# Patient Record
Sex: Female | Born: 1940 | Race: White | Hispanic: No | Smoking: Never smoker
Health system: Southern US, Community
[De-identification: ages and names within clinical notes are randomized; demographics above are authoritative.]

## PROBLEM LIST (undated history)

## (undated) DIAGNOSIS — F329 Major depressive disorder, single episode, unspecified: Secondary | ICD-10-CM

## (undated) DIAGNOSIS — M7989 Other specified soft tissue disorders: Secondary | ICD-10-CM

## (undated) DIAGNOSIS — D649 Anemia, unspecified: Secondary | ICD-10-CM

## (undated) DIAGNOSIS — F32A Depression, unspecified: Secondary | ICD-10-CM

## (undated) DIAGNOSIS — M79601 Pain in right arm: Secondary | ICD-10-CM

## (undated) DIAGNOSIS — R5383 Other fatigue: Secondary | ICD-10-CM

## (undated) DIAGNOSIS — R609 Edema, unspecified: Secondary | ICD-10-CM

## (undated) HISTORY — DX: Edema, unspecified: R60.9

## (undated) HISTORY — PX: CARPAL TUNNEL RELEASE: SHX101

## (undated) HISTORY — DX: Anemia, unspecified: D64.9

## (undated) HISTORY — PX: KNEE SURGERY: SHX244

## (undated) HISTORY — PX: EYE SURGERY: SHX253

## (undated) HISTORY — DX: Other specified soft tissue disorders: M79.89

## (undated) HISTORY — DX: Depression, unspecified: F32.A

## (undated) HISTORY — PX: ABDOMINAL HYSTERECTOMY: SHX81

## (undated) HISTORY — DX: Pain in right arm: M79.601

## (undated) HISTORY — DX: Other fatigue: R53.83

## (undated) HISTORY — DX: Major depressive disorder, single episode, unspecified: F32.9

---

## 2010-07-27 ENCOUNTER — Emergency Department (HOSPITAL_COMMUNITY)
Admission: EM | Admit: 2010-07-27 | Discharge: 2010-07-28 | Payer: Self-pay | Source: Home / Self Care | Admitting: Emergency Medicine

## 2010-07-28 ENCOUNTER — Emergency Department (HOSPITAL_COMMUNITY)
Admission: EM | Admit: 2010-07-28 | Discharge: 2010-07-28 | Payer: Self-pay | Source: Home / Self Care | Admitting: Emergency Medicine

## 2011-04-14 ENCOUNTER — Other Ambulatory Visit: Payer: Self-pay

## 2011-04-14 DIAGNOSIS — M7989 Other specified soft tissue disorders: Secondary | ICD-10-CM

## 2011-04-21 ENCOUNTER — Encounter: Payer: Self-pay | Admitting: Vascular Surgery

## 2011-04-25 ENCOUNTER — Other Ambulatory Visit: Payer: Self-pay | Admitting: Lab

## 2011-04-25 DIAGNOSIS — M7989 Other specified soft tissue disorders: Secondary | ICD-10-CM

## 2011-04-25 DIAGNOSIS — R609 Edema, unspecified: Secondary | ICD-10-CM

## 2011-04-27 ENCOUNTER — Encounter: Payer: Self-pay | Admitting: Vascular Surgery

## 2011-04-28 ENCOUNTER — Encounter: Payer: Self-pay | Admitting: Vascular Surgery

## 2011-04-28 ENCOUNTER — Ambulatory Visit (INDEPENDENT_AMBULATORY_CARE_PROVIDER_SITE_OTHER): Payer: Medicare Other | Admitting: Vascular Surgery

## 2011-04-28 VITALS — BP 106/70 | HR 65 | Resp 22 | Ht 60.0 in | Wt 168.0 lb

## 2011-04-28 DIAGNOSIS — M7989 Other specified soft tissue disorders: Secondary | ICD-10-CM

## 2011-04-28 DIAGNOSIS — R609 Edema, unspecified: Secondary | ICD-10-CM

## 2011-04-28 DIAGNOSIS — I83893 Varicose veins of bilateral lower extremities with other complications: Secondary | ICD-10-CM

## 2011-04-28 NOTE — Progress Notes (Signed)
[facility] [place] HISTORY AND PHYSICAL   History of Present Illness:  Patient is a 70 y.o. year old female who presents for evaluation of leg swelling.  The swelling primarily occurs at the ankles and feet that sometimes in the leg as well. Is been worse over the last week. The swelling has been present for proximal in 6 months. She denies prior history of DVT. She denies family history of varicose veins. She did buy some thin support stockings recently and states that they have helped somewhat. She also thinks is are swelling may be related to an anti-cholesterol medication.  She has had no previous interventions on her lower extremities for vein or arterial problems. She has had some work done on her right knee from a previous injury. She has not really on her feet that much during the day and is retired.  Past Medical History  Diagnosis Date  . Anemia   . Edema   . Bilateral swelling of feet   . Arm pain, right   . Depression   . Fatigue     Past Surgical History  Procedure Date  . Carpal tunnel release   . Abdominal hysterectomy   . Eye surgery     bilateral cataract surgery  . Knee surgery     ROS: [x]  Positive   [ ]  Negative   [ ]  All sytems reviewed and are negative  General:[ ]  Weight loss, [ ]  Fever, [ ]  chills Neurologic: [ ]  Dizziness, [ ]  Blackouts, [ ]  Seizure [ ]  Stroke, [ ]  "Mini stroke", [ ]  Slurred speech, [ ]  Temporary blindness;  [ ] weakness,  [ ]  Hoarseness Cardiac: [ ]  Chest pain/pressure, [ ]  Shortness of breath at rest [x ] Shortness of breath with exertion,  [ ]   Atrial fibrillation or irregular heartbeat Vascular:[x ] Pain in legs with walking, [ ]  Pain in legs at rest ,[ ]  Pain in legs at night,  [ ]   Non-healing ulcer, [ ]  Blood clot in vein/DVT,   Pulmonary: [ ]  Home oxygen, [ ]   Productive cough, [ ]  Coughing up blood,  [ ]  Asthma,  [ ]  Wheezing Musculoskeletal:  [ ]  Arthritis, [ ]  Low back pain,  [ ]  Joint pain Hematologic:[ ]   Easy Bruising, [x ] Anemia; [ ]  Hepatitis Gastrointestinal: [ ]  Blood in stool,  [ ]  Gastroesophageal Reflux, [ ]  Trouble swallowing Urinary: [ ]  chronic Kidney disease, [ ]  on HD - [ ]  MWF or [ ]  TTHS, [ ]  Burning with urination, [ ]  Frequent urination, [ ]  Difficulty urinating;  Skin: [ ]  Rashes, [ ]  Wounds Psychological: [x ] Anxiety,  [ x] Depression  Social History History  Substance Use Topics  . Smoking status: Never Smoker   . Smokeless tobacco: Not on file  . Alcohol Use: No    Family History History reviewed. No pertinent family history.  Allergies  Allergies  Allergen Reactions  . Latex   . Morphine And Related      Current Outpatient Prescriptions  Medication Sig Dispense Refill  . ALPRAZolam (XANAX) 1 MG tablet Take 1 mg by mouth at bedtime as needed.        Marland Kitchen FLUoxetine (PROZAC) 20 MG capsule Take 20 mg by mouth daily.        Marland Kitchen gemfibrozil (LOPID) 600 MG tablet Take 600 mg by mouth 2 (two) times daily before a meal.        . propranolol (INDERAL) 10 MG  tablet Take 10 mg by mouth 2 (two) times daily.        . valACYclovir (VALTREX) 500 MG tablet Take 500 mg by mouth as needed.        . zolpidem (AMBIEN) 10 MG tablet Take 10 mg by mouth at bedtime as needed.          Physical Examination  Filed Vitals:   04/28/11 1447  BP: 106/70  Pulse: 65  Resp: 22  Height: 5' (1.524 m)  Weight: 168 lb (76.204 kg)  SpO2: 98%    Body mass index is 32.81 kg/(m^2).  General:  Alert and oriented, no acute distress HEENT: Normal Neck: No bruit or JVD Pulmonary: Clear to auscultation bilaterally Cardiac: Regular Rate and Rhythm without murmur Abdomen: Soft, non-tender, non-distended, no mass, lower midline scar, obese Skin: No rash Extremity Pulses:  2+ radial, brachial, femoral, dorsalis pedis, posterior tibial pulses bilaterally Musculoskeletal: mild varus deformity right knee trace edema pretibial bilaterally Neurologic: Upper and lower extremity motor 5/5 and  symmetric  DATA: Venous duplex exam today shows a short midsegment of the left greater saphenous vein with reflux she also had evidence of reflux in the right lesser saphenous vein. This was mild bilaterally. There is no evidence of DVT. She also had some evidence of deep venous reflux in the left leg.   ASSESSMENT: Bilateral lower extremity swelling probably multifactorial in nature. Some component of this is due to mild reflux of her superficial and deep system primarily in the left leg. She has no evidence of arterial insufficiency with palpable pulses bilaterally.  PLAN: Since her symptoms are overall mild in character I believe the best option would be to place her and bilateral compression stockings. I offered her a prescription compression stockings today and she chooses to use the stockings that she currently has. She will discuss with her primary care doctor whether or not her cholesterol medication may be contributing. She will followup with me on an as-needed basis.  Fabienne Bruns, MD [facility] [place] Office: [phone] Pager: [phone]

## 2011-05-03 NOTE — Procedures (Unsigned)
LOWER EXTREMITY VENOUS REFLUX EXAM  INDICATION:  Lower extremity varicose veins, lower extremity edema.  EXAM:  Using color-flow imaging and pulse Doppler spectral analysis, the bilateral common femoral, superficial femoral, popliteal, posterior tibial, greater and lesser saphenous veins are evaluated.  There is evidence suggesting deep venous insufficiency in the left lower extremity.  There is no evidence of suggesting deep venous insufficiency in the right lower extremity.  The bilateral saphenofemoral junctions are competent. The right GSV competent.  The left GSV is not competent with reflux >500 milliseconds with the caliber s described below.   The right proximal short saphenous vein demonstrates incompetency with diameter measurements ranging from 0.50 to 0.21 cm.  The left proximal small saphenous vein demonstrates competency.  GSV Diameter (used if found to be incompetent only)                                           Right    Left Proximal Greater Saphenous Vein           cm       cm Proximal-to-mid-thigh                     cm       cm Mid thigh                                 cm       0.34 cm Mid-distal thigh                          cm       cm Distal thigh                              cm       cm Knee                                      cm       cm  IMPRESSION: 1. The right great saphenous vein is competent. 2. The left great saphenous vein is not competent with reflux >500     milliseconds. 3. The bilateral great saphenous veins are tortuous. 4. The deep venous system of the right lower extremity is competent. 5. The left deep venous system is not competent with reflux of >500     milliseconds. 6. The right small saphenous vein is competent. 7. The left small saphenous vein is not competent with reflux >500     milliseconds with the calibers as described above.  ___________________________________________ Janetta Hora. Fields, MD  SH/MEDQ   D:  04/28/2011  T:  04/28/2011  Job:  [id number]

## 2011-09-19 ENCOUNTER — Ambulatory Visit: Payer: Self-pay | Admitting: Family Medicine

## 2011-09-26 ENCOUNTER — Ambulatory Visit: Payer: Self-pay | Admitting: Family Medicine
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
## 2012-01-27 ENCOUNTER — Other Ambulatory Visit: Payer: Self-pay | Admitting: Obstetrics and Gynecology

## 2012-01-27 DIAGNOSIS — Z1231 Encounter for screening mammogram for malignant neoplasm of breast: Secondary | ICD-10-CM

## 2012-03-14 ENCOUNTER — Ambulatory Visit: Payer: Medicare Other

## 2012-03-15 ENCOUNTER — Ambulatory Visit
Admission: RE | Admit: 2012-03-15 | Discharge: 2012-03-15 | Disposition: A | Discharge status: Home / Self Care | Payer: Medicare Other | Source: Ambulatory Visit | Attending: Obstetrics and Gynecology | Admitting: Obstetrics and Gynecology

## 2012-03-15 DIAGNOSIS — Z1231 Encounter for screening mammogram for malignant neoplasm of breast: Secondary | ICD-10-CM

## 2012-03-22 ENCOUNTER — Ambulatory Visit: Payer: Self-pay | Admitting: Family Medicine

## 2012-03-23 ENCOUNTER — Encounter: Payer: Self-pay | Admitting: Family Medicine

## 2012-03-23 ENCOUNTER — Telehealth: Payer: Self-pay | Admitting: Family Medicine

## 2012-03-23 ENCOUNTER — Ambulatory Visit (INDEPENDENT_AMBULATORY_CARE_PROVIDER_SITE_OTHER): Payer: Medicare Other | Admitting: Family Medicine

## 2012-03-23 VITALS — BP 105/79 | HR 78 | Temp 97.8°F | Resp 16 | Ht 60.0 in | Wt 179.0 lb

## 2012-03-23 DIAGNOSIS — G250 Essential tremor: Secondary | ICD-10-CM | POA: Insufficient documentation

## 2012-03-23 DIAGNOSIS — N760 Acute vaginitis: Secondary | ICD-10-CM

## 2012-03-23 DIAGNOSIS — B009 Herpesviral infection, unspecified: Secondary | ICD-10-CM | POA: Insufficient documentation

## 2012-03-23 DIAGNOSIS — R600 Localized edema: Secondary | ICD-10-CM | POA: Insufficient documentation

## 2012-03-23 DIAGNOSIS — F418 Other specified anxiety disorders: Secondary | ICD-10-CM | POA: Insufficient documentation

## 2012-03-23 DIAGNOSIS — E785 Hyperlipidemia, unspecified: Secondary | ICD-10-CM | POA: Insufficient documentation

## 2012-03-23 DIAGNOSIS — R609 Edema, unspecified: Secondary | ICD-10-CM

## 2012-03-23 LAB — CBC WITH DIFFERENTIAL/PLATELET
Basophils Absolute: 0.1 10*3/uL (ref 0.0–0.1)
Basophils Relative: 1 % (ref 0–1)
MCHC: 34.6 g/dL (ref 30.0–36.0)
Monocytes Absolute: 0.7 10*3/uL (ref 0.1–1.0)
Neutro Abs: 3.2 10*3/uL (ref 1.7–7.7)
Neutrophils Relative %: 46 % (ref 43–77)
Platelets: 257 10*3/uL (ref 150–400)
RDW: 12.6 % (ref 11.5–15.5)

## 2012-03-23 LAB — POCT WET PREP WITH KOH
Clue Cells Wet Prep HPF POC: NEGATIVE
KOH Prep POC: NEGATIVE
Trichomonas, UA: NEGATIVE

## 2012-03-23 LAB — COMPREHENSIVE METABOLIC PANEL
ALT: 10 U/L (ref 0–35)
Albumin: 4 g/dL (ref 3.5–5.2)
CO2: 27 mEq/L (ref 19–32)
Calcium: 9.4 mg/dL (ref 8.4–10.5)
Chloride: 105 mEq/L (ref 96–112)
Glucose, Bld: 81 mg/dL (ref 70–99)
Sodium: 140 mEq/L (ref 135–145)
Total Protein: 6.3 g/dL (ref 6.0–8.3)

## 2012-03-23 LAB — TSH: TSH: 0.98 u[IU]/mL (ref 0.350–4.500)

## 2012-03-23 MED ORDER — PROPRANOLOL HCL ER 80 MG PO CP24: ORAL_CAPSULE | ORAL | Status: DC

## 2012-03-23 MED ORDER — PROPRANOLOL HCL 60 MG PO TABS: 60.0000 mg | ORAL_TABLET | Freq: Three times a day (TID) | ORAL | Status: DC

## 2012-03-23 MED ORDER — FUROSEMIDE 20 MG PO TABS: ORAL_TABLET | ORAL | Status: DC

## 2012-03-23 NOTE — Patient Instructions (Addendum)
Peripheral Edema You have swelling in your legs (peripheral edema). This swelling is due to excess accumulation of salt and water in your body. Edema may be a sign of heart, kidney or liver disease, or a side effect of a medication. It may also be due to problems in the leg veins. Elevating your legs and using special support stockings may be very helpful, if the cause of the swelling is due to poor venous circulation. Avoid long periods of standing, whatever the cause. Treatment of edema depends on identifying the cause. Chips, pretzels, pickles and other salty foods should be avoided. Restricting salt in your diet is almost always needed. Water pills (diuretics) are often used to remove the excess salt and water from your body via urine. These medicines prevent the kidney from reabsorbing sodium. This increases urine flow. Diuretic treatment may also result in lowering of potassium levels in your body. Potassium supplements may be needed if you have to use diuretics daily. Daily weights can help you keep track of your progress in clearing your edema. You should call your caregiver for follow up care as recommended. SEEK IMMEDIATE MEDICAL CARE IF:   You have increased swelling, pain, redness, or heat in your legs.   You develop shortness of breath, especially when lying down.   You develop chest or abdominal pain, weakness, or fainting.   You have a fever.  Document Released: 08/11/2004 Document Revised: 06/23/2011 Document Reviewed: 07/22/2009 [facility] Patient Information 2012 [place], [place].   You need to try to elevate your legs twice a day for 30 minutes if possible to help relieve swelling in legs.   Tremor Tremor is a rhythmic, involuntary muscular contraction characterized by oscillations (to-and-fro movements) of a part of the body. The most common of all involuntary movements, tremor can affect various body parts such as the hands, head, facial structures, vocal cords, trunk, and  legs; most tremors, however, occur in the hands. Tremor often accompanies neurological disorders associated with aging. Although the disorder is not life-threatening, it can be responsible for functional disability and social embarrassment. TREATMENT  There are many types of tremor and several ways in which tremor is classified. The most common classification is by behavioral context or position. There are five categories of tremor within this classification: resting, postural, kinetic, task-specific, and psychogenic. Resting or static tremor occurs when the muscle is at rest, for example when the hands are lying on the lap. This type of tremor is often seen in patients with Parkinson's disease. Postural tremor occurs when a patient attempts to maintain posture, such as holding the hands outstretched. Postural tremors include physiological tremor, essential tremor, tremor with basal ganglia disease (also seen in patients with Parkinson's disease), cerebellar postural tremor, tremor with peripheral neuropathy, post-traumatic tremor, and alcoholic tremor. Kinetic or intention (action) tremor occurs during purposeful movement, for example during finger-to-nose testing. Task-specific tremor appears when performing goal-oriented tasks such as handwriting, speaking, or standing. This group consists of primary writing tremor, vocal tremor, and orthostatic tremor. Psychogenic tremor occurs in both older and younger patients. The key feature of this tremor is that it dramatically lessens or disappears when the patient is distracted. PROGNOSIS There are some treatment options available for tremor; the appropriate treatment depends on accurate diagnosis of the cause. Some tremors respond to treatment of the underlying condition, for example in some cases of psychogenic tremor treating the patient's underlying mental problem may cause the tremor to disappear. Also, patients with tremor due to Parkinson's disease may  be  treated with Levodopa drug therapy. Symptomatic drug therapy is available for several other tremors as well. For those cases of tremor in which there is no effective drug treatment, physical measures such as teaching the patient to brace the affected limb during the tremor are sometimes useful. Surgical intervention such as thalamotomy or deep brain stimulation may be useful in certain cases. Document Released: 06/24/2002 Document Revised: 06/23/2011 Document Reviewed: 07/04/2005 [facility] Patient Information 2012 [place], [place].
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
## 2012-03-28 ENCOUNTER — Encounter: Payer: Self-pay | Admitting: Family Medicine

## 2012-03-28 NOTE — Progress Notes (Signed)
Quick Note:  Please notify pt that results are normal.   Provide pt with copy of labs. ______ 

## 2012-04-02 NOTE — Telephone Encounter (Signed)
There was some confusion about the dose of Propranolol that the pt was taking; she wanted it increased, if possible, from twice a day (it was unclear which formulation she was taking- ?ER). We requested a fax from Dr. [name] to clarify dose; that Provider did increase dose from Propranolol ER 80 mg 1x daily to 2x daily. I refilled the medication at that same dose and frequency and left a phone msg for the pt to advise her of this.

## 2012-04-26 ENCOUNTER — Encounter: Payer: Self-pay | Admitting: Family Medicine

## 2012-04-26 ENCOUNTER — Ambulatory Visit (INDEPENDENT_AMBULATORY_CARE_PROVIDER_SITE_OTHER): Payer: Medicare Other | Admitting: Family Medicine

## 2012-04-26 VITALS — BP 115/70 | HR 64 | Temp 97.7°F | Resp 16 | Ht 60.0 in | Wt 135.0 lb

## 2012-04-26 DIAGNOSIS — R9431 Abnormal electrocardiogram [ECG] [EKG]: Secondary | ICD-10-CM

## 2012-04-26 DIAGNOSIS — R609 Edema, unspecified: Secondary | ICD-10-CM

## 2012-04-26 DIAGNOSIS — R600 Localized edema: Secondary | ICD-10-CM

## 2012-04-26 DIAGNOSIS — Z0000 Encounter for general adult medical examination without abnormal findings: Secondary | ICD-10-CM

## 2012-04-26 LAB — POCT URINALYSIS DIPSTICK
Ketones, UA: NEGATIVE
Leukocytes, UA: NEGATIVE
Protein, UA: NEGATIVE
pH, UA: 7

## 2012-04-26 MED ORDER — VALACYCLOVIR HCL 500 MG PO TABS: 500.0000 mg | ORAL_TABLET | ORAL | Status: DC | PRN

## 2012-04-26 MED ORDER — GEMFIBROZIL 600 MG PO TABS: 600.0000 mg | ORAL_TABLET | Freq: Two times a day (BID) | ORAL | Status: DC

## 2012-04-26 MED ORDER — FUROSEMIDE 20 MG PO TABS: ORAL_TABLET | ORAL | Status: DC

## 2012-04-27 ENCOUNTER — Telehealth: Payer: Self-pay

## 2012-04-27 NOTE — Telephone Encounter (Signed)
The patient called back to make sure that we have steroids as an allergy for her.  The patient is also questioning something about a heart monitor.  Please call the patient to clarify.  Patient phone number is [phone].

## 2012-04-27 NOTE — Telephone Encounter (Signed)
The patient called to verify that Prozac was taken out of her record since she is on Pristiq and that medication is working much better than the Prozac she was on previously.  Please call patient at [phone] with any questions.

## 2012-04-28 NOTE — Telephone Encounter (Signed)
Left message for patient to return call.

## 2012-05-01 ENCOUNTER — Encounter: Payer: Self-pay | Admitting: Family Medicine

## 2012-05-01 ENCOUNTER — Other Ambulatory Visit: Payer: Self-pay | Admitting: Internal Medicine

## 2012-05-01 NOTE — Progress Notes (Signed)
Subjective:    Patient ID: Alexis Pham, female    DOB: 15-Jul-1941, 71 y.o.   MRN: [id number]  HPI  This 71 y.o. Female is here for Annual Medicare Wellness visit. She has chronic depression  which is managed by Dr. [name]; she completed a BECK'S DEPRESSION SCALE (Total score=6).  She has no Home Safety issues.  She exercises daily, walking for 30 minutes.   Last PAP: Oct 2013 (Abnormal)  Last MMG: Oct 2013 (Normal)  Last DEXA: 2013 (Normal)  Last CRS: 2013 (Normal)  Review of Systems  HENT: Positive for dental problem.   Eyes: Positive for discharge, redness, itching and visual disturbance.       Sees an Eye specialist in [place], [place].  Cardiovascular: Positive for leg swelling.  Gastrointestinal: Positive for abdominal pain, diarrhea, constipation and anal bleeding.       Sees a GI specialist here in [place]  Genitourinary: Positive for vaginal discharge, difficulty urinating and genital sores.       GYN exam done elsewhere  Musculoskeletal: Positive for arthralgias.  Neurological: Positive for tremors.  Psychiatric/Behavioral: Positive for disturbed wake/sleep cycle and dysphoric mood.       Dr. [name] has changed med to Pristiq which pt states is very effective  All other systems reviewed and are negative.       Objective:   Physical Exam  Vitals reviewed. Constitutional: She is oriented to person, place, and time. She appears well-developed and well-nourished. No distress.  HENT:  Head: Normocephalic and atraumatic.  Right Ear: Hearing, tympanic membrane, external ear and ear canal normal.  Left Ear: Hearing, tympanic membrane, external ear and ear canal normal.  Nose: Nose normal. No mucosal edema, nasal deformity or septal deviation. Right sinus exhibits no maxillary sinus tenderness and no frontal sinus tenderness. Left sinus exhibits no maxillary sinus tenderness and no frontal sinus tenderness.  Mouth/Throat: Uvula is midline, oropharynx is clear and moist and  mucous membranes are normal. No oral lesions. Normal dentition.  Eyes: Conjunctivae normal, EOM and lids are normal. Pupils are equal, round, and reactive to light.  Neck: Normal range of motion. Neck supple. No thyromegaly present.  Cardiovascular: Normal rate, regular rhythm, normal heart sounds and intact distal pulses.  Exam reveals no gallop and no friction rub.   No murmur heard. Pulmonary/Chest: Effort normal and breath sounds normal. No respiratory distress. She has no wheezes.  Abdominal: Soft. Normal appearance. She exhibits no distension, no abdominal bruit, no pulsatile midline mass and no mass. Bowel sounds are increased. There is no hepatosplenomegaly. There is tenderness in the epigastric area and suprapubic area. There is no rigidity, no rebound, no guarding, no CVA tenderness, no tenderness at McBurney's point and negative Murphy's sign.  Genitourinary:       Deferred  Musculoskeletal: Normal range of motion. She exhibits tenderness. She exhibits no edema.       Stiffness and minimal crepitus noted in major joints; no redness or significant deformities  Lymphadenopathy:    She has no cervical adenopathy.  Neurological: She is alert and oriented to person, place, and time. She has normal strength and normal reflexes. She displays tremor. No cranial nerve deficit or sensory deficit. She exhibits normal muscle tone. Coordination and gait normal.  Skin: Skin is warm and dry. No rash noted. No erythema. No pallor.  Psychiatric: She has a normal mood and affect. Her behavior is normal. Judgment and thought content normal.    Results for orders placed in visit on 04/26/12  POCT URINALYSIS DIPSTICK      Component Value Range   Color, UA yellow     Clarity, UA clear     Glucose, UA neg     Bilirubin, UA neg     Ketones, UA neg     Spec Grav, UA 1.015     Blood, UA neg     pH, UA 7.0     Protein, UA neg     Urobilinogen, UA 0.2     Nitrite, UA neg     Leukocytes, UA Negative         ECG: Sinus rhythm with borderline 1st degree A-V block; inferior and anterior septal ST-T changes.     Assessment & Plan:   1. Routine general medical examination at a health care facility  POCT urinalysis dipstick, EKG 12-Lead  2. Abnormal ECG  Ambulatory referral to Cardiology  3. Lower extremity edema  RF: Furosemide 20 mg 1/2 - 1 tablet every AM prn.

## 2012-05-02 NOTE — Telephone Encounter (Signed)
Spoke with pt, she advised me she was talking about a bill. Billing took care of it.

## 2012-05-28 ENCOUNTER — Encounter: Payer: Self-pay | Admitting: Cardiovascular Disease

## 2012-05-28 ENCOUNTER — Ambulatory Visit (INDEPENDENT_AMBULATORY_CARE_PROVIDER_SITE_OTHER): Payer: Medicare Other | Admitting: Cardiovascular Disease

## 2012-05-28 VITALS — BP 113/76 | HR 63 | Wt 165.0 lb

## 2012-05-28 DIAGNOSIS — Z79899 Other long term (current) drug therapy: Secondary | ICD-10-CM

## 2012-05-28 DIAGNOSIS — R0989 Other specified symptoms and signs involving the circulatory and respiratory systems: Secondary | ICD-10-CM

## 2012-05-28 DIAGNOSIS — Z139 Encounter for screening, unspecified: Secondary | ICD-10-CM

## 2012-05-28 DIAGNOSIS — R0609 Other forms of dyspnea: Secondary | ICD-10-CM

## 2012-05-28 DIAGNOSIS — R0600 Dyspnea, unspecified: Secondary | ICD-10-CM

## 2012-05-28 DIAGNOSIS — R609 Edema, unspecified: Secondary | ICD-10-CM

## 2012-05-28 DIAGNOSIS — R9431 Abnormal electrocardiogram [ECG] [EKG]: Secondary | ICD-10-CM

## 2012-05-28 DIAGNOSIS — R600 Localized edema: Secondary | ICD-10-CM

## 2012-05-28 DIAGNOSIS — R079 Chest pain, unspecified: Secondary | ICD-10-CM

## 2012-05-28 MED ORDER — FUROSEMIDE 20 MG PO TABS: ORAL_TABLET | ORAL | Status: DC

## 2012-05-28 NOTE — Assessment & Plan Note (Signed)
Refill for lasix called in Continue low sodium diet  Echo for RV/LV function

## 2012-05-28 NOTE — Assessment & Plan Note (Signed)
I assume this is why she is on inderal stable

## 2012-05-28 NOTE — Assessment & Plan Note (Signed)
Stable continue current meds and F/U Dr [name] q3 months

## 2012-05-28 NOTE — Assessment & Plan Note (Signed)
Cholesterol is at goal.  Continue current dose of statin and diet Rx.  No myalgias or side effects.  F/U  LFT's in 6 months. No results found for this basename: LDLCALC             

## 2012-05-28 NOTE — Progress Notes (Signed)
Patient ID: Alexis Pham, female   DOB: Nov 27, 1940, 71 y.o.   MRN: [id number] 71 yo referred by Dr Alexis Pham for abnormal ECG  Chronic depression.  Sees Alexis Pham.  Denies being told of abnormal ECG in past.  Saw primary and has mild back and chest Pain that sounds muscular.  ECG with anterolateral T wave changes. Complains of LE edema.  No pleurisy or lung disease Non smoker.  No history of CAD syncope Has  Not had a stress test in past  Denies excess salt in diet   ROS: Denies fever, malais, weight loss, blurry vision, decreased visual acuity, cough, sputum, SOB, hemoptysis, pleuritic pain, palpitaitons, heartburn, abdominal pain, melena, lower extremity edema, claudication, or rash.  All other systems reviewed and negative   General: Affect appropriate Healthy:  appears stated age HEENT: normal Neck supple with no adenopathy JVP normal no bruits no thyromegaly Lungs clear with no wheezing and good diaphragmatic motion Heart:  S1/S2 no murmur,rub, gallop or click PMI normal Abdomen: benighn, BS positve, no tenderness, no AAA no bruit.  No HSM or HJR Distal pulses intact with no bruits No edema Neuro non-focal Skin warm and dry No muscular weakness  Medications Current Outpatient Prescriptions  Medication Sig Dispense Refill  . ALPRAZolam (XANAX) 1 MG tablet Take 1 mg by mouth 4 (four) times daily.       Marland Kitchen desvenlafaxine (PRISTIQ) 50 MG 24 hr tablet Take 50 mg by mouth daily.      . DULoxetine (CYMBALTA) 30 MG capsule Take 30 mg by mouth daily.      . DULoxetine (CYMBALTA) 60 MG capsule Take 60 mg by mouth daily.      Marland Kitchen FLUoxetine (PROZAC) 20 MG capsule Take 20 mg by mouth daily.        . furosemide (LASIX) 20 MG tablet Take 1/2 - 1 tablet every AM prn swelling of legs.  30 tablet  2  . propranolol ER (INDERAL LA) 80 MG 24 hr capsule Take 1 capsule by mouth twice a day.  60 capsule  5  . valACYclovir (VALTREX) 500 MG tablet Take 1 tablet (500 mg total) by mouth as needed.  20  tablet  3  . zolpidem (AMBIEN) 10 MG tablet Take 10 mg by mouth at bedtime as needed.          Allergies Latex and Morphine and related  Family History: Family History  Problem Relation Age of Onset  . Cancer Father 57    Leukemia  . Alcohol abuse Maternal Grandfather     Social History: History   Social History  . Marital Status: Divorced    Spouse Name: N/A    Number of Children: N/A  . Years of Education: N/A   Occupational History  . Not on file.   Social History Main Topics  . Smoking status: Never Smoker   . Smokeless tobacco: Not on file  . Alcohol Use: No  . Drug Use: No  . Sexually Active: Not Currently   Other Topics Concern  . Not on file   Social History Narrative  . No narrative on file    Electrocardiogram:  NSR rate 64 anterolateral T wave inversions.    Assessment and Plan

## 2012-05-28 NOTE — Assessment & Plan Note (Signed)
Check echo R/O HOCM no murmur on exam

## 2012-05-28 NOTE — Addendum Note (Signed)
Addended by: Scherrie Bateman E on: 05/28/2012 12:31 PM   Modules accepted: Orders

## 2012-05-28 NOTE — Assessment & Plan Note (Signed)
Atypical but very abnormal ECG  F/U lexiscan myovue.  She does not think she can walk on treadmill

## 2012-05-28 NOTE — Patient Instructions (Addendum)
Your physician recommends that you schedule a follow-up appointment in:  AS NEEDED  Your physician recommends that you continue on your current medications as directed. Please refer to the Current Medication list given to you today.  Your physician has requested that you have a lexiscan myoview. For further information please visit [email or web address] Please follow instruction sheet, as given. DX ABN EKG  Your physician has requested that you have an echocardiogram. Echocardiography is a painless test that uses sound waves to create images of your heart. It provides your doctor with information about the size and shape of your heart and how well your heart's chambers and valves are working. This procedure takes approximately one hour. There are no restrictions for this procedure.  DX DYSPNEA  Your physician recommends that you return for lab work in: TODAY HGB A1C

## 2012-05-29 ENCOUNTER — Other Ambulatory Visit: Payer: Self-pay

## 2012-05-29 ENCOUNTER — Ambulatory Visit (HOSPITAL_COMMUNITY): Payer: Medicare Other | Attending: Cardiology | Admitting: Radiology

## 2012-05-29 DIAGNOSIS — R0600 Dyspnea, unspecified: Secondary | ICD-10-CM

## 2012-05-29 DIAGNOSIS — R072 Precordial pain: Secondary | ICD-10-CM | POA: Insufficient documentation

## 2012-05-29 NOTE — Progress Notes (Signed)
Echocardiogram performed.  

## 2012-06-01 ENCOUNTER — Telehealth: Payer: Self-pay | Admitting: *Deleted

## 2012-06-04 NOTE — Telephone Encounter (Signed)
ERROR

## 2012-06-05 ENCOUNTER — Ambulatory Visit (HOSPITAL_COMMUNITY): Payer: Medicare Other | Attending: Cardiology | Admitting: Radiology

## 2012-06-05 VITALS — BP 112/83 | HR 56 | Ht 60.0 in | Wt 172.0 lb

## 2012-06-05 DIAGNOSIS — R9431 Abnormal electrocardiogram [ECG] [EKG]: Secondary | ICD-10-CM

## 2012-06-05 DIAGNOSIS — M79609 Pain in unspecified limb: Secondary | ICD-10-CM | POA: Insufficient documentation

## 2012-06-05 DIAGNOSIS — R55 Syncope and collapse: Secondary | ICD-10-CM | POA: Insufficient documentation

## 2012-06-05 DIAGNOSIS — R5381 Other malaise: Secondary | ICD-10-CM | POA: Insufficient documentation

## 2012-06-05 DIAGNOSIS — R42 Dizziness and giddiness: Secondary | ICD-10-CM | POA: Insufficient documentation

## 2012-06-05 DIAGNOSIS — M7989 Other specified soft tissue disorders: Secondary | ICD-10-CM | POA: Insufficient documentation

## 2012-06-05 DIAGNOSIS — R079 Chest pain, unspecified: Secondary | ICD-10-CM

## 2012-06-05 MED ORDER — TECHNETIUM TC 99M SESTAMIBI GENERIC - CARDIOLITE
11.0000 | Freq: Once | INTRAVENOUS | Status: AC | PRN
  Administered 2012-06-05: 11 via INTRAVENOUS

## 2012-06-05 MED ORDER — REGADENOSON 0.4 MG/5ML IV SOLN
0.4000 mg | Freq: Once | INTRAVENOUS | Status: AC
  Administered 2012-06-05: 0.4 mg via INTRAVENOUS

## 2012-06-05 MED ORDER — TECHNETIUM TC 99M SESTAMIBI GENERIC - CARDIOLITE
33.0000 | Freq: Once | INTRAVENOUS | Status: AC | PRN
  Administered 2012-06-05: 33 via INTRAVENOUS

## 2012-06-05 NOTE — Progress Notes (Signed)
[facility] SITE 3 NUCLEAR MED [id number] [place] [address] [phone]  Cardiology Nuclear Med Study  Alexis Pham is a 71 y.o. female     MRN : [id number]     DOB: [birth year]  Procedure Date: 06/05/2012  Nuclear Med Background Indication for Stress Test:  Evaluation for Ischemia and Abnormal EKG History:  05/29/12 Echo:EF=55%, mild LVH Cardiac Risk Factors: Lipids and Obesity  Symptoms:  Chest Pressure/Tightness (last episode of chest discomfort was last week), Dizziness/Near Syncope, (B) Leg Pain/Edema with Walking and Fatigue    Nuclear Pre-Procedure Caffeine/Decaff Intake:  None > 12 hrs NPO After: 10:00pm   Lungs:  Clear. O2 Sat: 95% on room air. IV 0.9% NS with Angio Cath:  22g  IV Site: L Antecubital x 1, tolerated well IV Started by:  Irean Hong, RN  Chest Size (in):  42 Cup Size: C  Height: 5' (1.524 m)  Weight:  172 lb (78.019 kg)  BMI:  Body mass index is 33.59 kg/(m^2). Tech Comments:  Held propranolol this am    Nuclear Med Study 1 or 2 day study: 1 day  Stress Test Type:  Treadmill/Lexiscan  Reading MD: Marca Ancona, MD  Order Authorizing Provider:  Charlton Haws, MD  Resting Radionuclide: Technetium 30m Sestamibi  Resting Radionuclide Dose: 11.0 mCi   Stress Radionuclide:  Technetium 47m Sestamibi  Stress Radionuclide Dose: 33.0 mCi           Stress Protocol Rest HR: 56 Stress HR: 87  Rest BP: 112/83 Stress BP: 121/77  Exercise Time (min): 2:00 METS: n/a   Predicted Max HR: 151 bpm % Max HR: 57.62 bpm Rate Pressure Product: 27253   Dose of Adenosine (mg):  n/a Dose of Lexiscan: 0.4 mg  Dose of Atropine (mg): n/a Dose of Dobutamine: n/a mcg/kg/min (at max HR)  Stress Test Technologist: Smiley Houseman, CMA-N  Nuclear Technologist:  Domenic Polite, CNMT     Rest Procedure:  Myocardial perfusion imaging was performed at rest 45 minutes following the intravenous administration of Technetium 35m  Sestamibi.  Rest ECG: Nonspecific T-wave changes.  Stress Procedure:  The patient received IV Lexiscan 0.4 mg over 15-seconds with concurrent low level exercise and then Technetium 19m Sestamibi was injected at 30-seconds while the patient continued walking one more minute. There were more diffuse ST-T wave changes with Lexiscan. Quantitative spect images were obtained after a 45-minute delay.  Stress ECG: No significant change from baseline ECG  QPS Raw Data Images:  Normal; no motion artifact; normal heart/lung ratio. Stress Images:  Small, mild apical perfusion defect.  Rest Images:  Normal homogeneous uptake in all areas of the myocardium. Subtraction (SDS):  Reversible small, mild apical perfusion defect.  Transient Ischemic Dilatation (Normal <1.22):  1.03 Lung/Heart Ratio (Normal <0.45):  0.46  Quantitative Gated Spect Images QGS EDV:  56 ml QGS ESV:  11 ml  Impression Exercise Capacity:  Lexiscan with low level exercise. BP Response:  Normal blood pressure response. Clinical Symptoms:  Dizzy, short of breath.  ECG Impression:  Baseline slight anterior ST depression and T wave inversion, not significantly changed with infusion.  Comparison with Prior Nuclear Study: No previous nuclear study performed  Overall Impression:  Low risk stress nuclear study.  Reversible small, mild apical perfusion defect.  There was prominent breast shadowing on planar images so this may be shifting breast artifact.  Cannot rule out small area of ischemia.   LV Ejection Fraction: 80%.  LV Wall Motion:  NL LV Function; NL Wall Motion  Marca Ancona 06/05/2012

## 2012-06-22 ENCOUNTER — Ambulatory Visit: Payer: Self-pay | Admitting: Family Medicine

## 2012-06-27 ENCOUNTER — Telehealth: Payer: Self-pay

## 2012-06-27 NOTE — Telephone Encounter (Signed)
The patient called to cancel her appointment on 06/28/12 at 1pm due to insurance issues, but also wanted to see if Dr. Audria Nine would write her a prescription for Mobic for her knee pain and swelling s/p knee surgery.  Please call the patient to reschedule her appointment with Dr. Audria Nine once her insurance is sorted out and to discuss the mobic rx. Patient phone # [phone].

## 2012-06-28 ENCOUNTER — Encounter: Payer: Self-pay | Admitting: Family Medicine

## 2012-06-28 MED ORDER — MELOXICAM 7.5 MG PO TABS: ORAL_TABLET | ORAL | Status: DC

## 2012-06-28 NOTE — Telephone Encounter (Signed)
Patient notified and voiced understanding.

## 2012-06-28 NOTE — Telephone Encounter (Signed)
I called prescription for Meloxicam 7.5 mg #40 w/ 1 refill to pt's pharmacy ([facility]). Please contact her to let her know she can pick it up this afternoon. Thanks.

## 2012-06-28 NOTE — Progress Notes (Signed)
This encounter was created in error - please disregard.

## 2012-06-29 ENCOUNTER — Encounter: Payer: Self-pay | Admitting: Family Medicine

## 2012-06-29 DIAGNOSIS — R87619 Unspecified abnormal cytological findings in specimens from cervix uteri: Secondary | ICD-10-CM | POA: Insufficient documentation

## 2012-10-30 ENCOUNTER — Encounter: Payer: Self-pay | Admitting: Family Medicine

## 2012-10-30 DIAGNOSIS — K621 Rectal polyp: Secondary | ICD-10-CM | POA: Insufficient documentation

## 2012-10-30 DIAGNOSIS — K620 Anal polyp: Secondary | ICD-10-CM | POA: Insufficient documentation

## 2012-12-11 ENCOUNTER — Ambulatory Visit (INDEPENDENT_AMBULATORY_CARE_PROVIDER_SITE_OTHER): Payer: Medicare Other | Admitting: Family Medicine

## 2012-12-11 VITALS — BP 108/72 | HR 72 | Temp 97.7°F | Resp 22 | Ht 58.5 in | Wt 162.0 lb

## 2012-12-11 DIAGNOSIS — G4700 Insomnia, unspecified: Secondary | ICD-10-CM

## 2012-12-11 NOTE — Patient Instructions (Addendum)
I am sorry you are having so much trouble sleeping Take an [place] tonight and see if this helps.  I am sorry to hear about your mother.  Try not watching television in your room.  This will not help.  Follow up with Dr. Orson Ape in the next week.    Insomnia Insomnia is frequent trouble falling and/or staying asleep. Insomnia can be a long term problem or a short term problem. Both are common. Insomnia can be a short term problem when the wakefulness is related to a certain stress or worry. Long term insomnia is often related to ongoing stress during waking hours and/or poor sleeping habits. Overtime, sleep deprivation itself can make the problem worse. Every little thing feels more severe because you are overtired and your ability to cope is decreased. CAUSES   Stress, anxiety, and depression.  Poor sleeping habits.  Distractions such as TV in the bedroom.  Naps close to bedtime.  Engaging in emotionally charged conversations before bed.  Technical reading before sleep.  Alcohol and other sedatives. They may make the problem worse. They can hurt normal sleep patterns and normal dream activity.  Stimulants such as caffeine for several hours prior to bedtime.  Pain syndromes and shortness of breath can cause insomnia.  Exercise late at night.  Changing time zones may cause sleeping problems (jet lag). It is sometimes helpful to have someone observe your sleeping patterns. They should look for periods of not breathing during the night (sleep apnea). They should also look to see how long those periods last. If you live alone or observers are uncertain, you can also be observed at a sleep clinic where your sleep patterns will be professionally monitored. Sleep apnea requires a checkup and treatment. Give your caregivers your medical history. Give your caregivers observations your family has made about your sleep.  SYMPTOMS   Not feeling rested in the morning.  Anxiety and  restlessness at bedtime.  Difficulty falling and staying asleep. TREATMENT   Your caregiver may prescribe treatment for an underlying medical disorders. Your caregiver can give advice or help if you are using alcohol or other drugs for self-medication. Treatment of underlying problems will usually eliminate insomnia problems.  Medications can be prescribed for short time use. They are generally not recommended for lengthy use.  Over-the-counter sleep medicines are not recommended for lengthy use. They can be habit forming.  You can promote easier sleeping by making lifestyle changes such as:  Using relaxation techniques that help with breathing and reduce muscle tension.  Exercising earlier in the day.  Changing your diet and the time of your last meal. No night time snacks.  Establish a regular time to go to bed.  Counseling can help with stressful problems and worry.  Soothing music and white noise may be helpful if there are background noises you cannot remove.  Stop tedious detailed work at least one hour before bedtime. HOME CARE INSTRUCTIONS   Keep a diary. Inform your caregiver about your progress. This includes any medication side effects. See your caregiver regularly. Take note of:  Times when you are asleep.  Times when you are awake during the night.  The quality of your sleep.  How you feel the next day. This information will help your caregiver care for you.  Get out of bed if you are still awake after 15 minutes. Read or do some quiet activity. Keep the lights down. Wait until you feel sleepy and go back to bed.  Keep  regular sleeping and waking hours. Avoid naps.  Exercise regularly.  Avoid distractions at bedtime. Distractions include watching television or engaging in any intense or detailed activity like attempting to balance the household checkbook.  Develop a bedtime ritual. Keep a familiar routine of bathing, brushing your teeth, climbing into bed  at the same time each night, listening to soothing music. Routines increase the success of falling to sleep faster.  Use relaxation techniques. This can be using breathing and muscle tension release routines. It can also include visualizing peaceful scenes. You can also help control troubling or intruding thoughts by keeping your mind occupied with boring or repetitive thoughts like the old concept of counting sheep. You can make it more creative like imagining planting one beautiful flower after another in your backyard garden.  During your day, work to eliminate stress. When this is not possible use some of the previous suggestions to help reduce the anxiety that accompanies stressful situations. MAKE SURE YOU:   Understand these instructions.  Will watch your condition.  Will get help right away if you are not doing well or get worse. Document Released: 07/01/2000 Document Revised: 09/26/2011 Document Reviewed: 08/01/2007 [facility] Patient Information 2014 [place], [place].

## 2012-12-11 NOTE — Progress Notes (Signed)
Patient is a pleasant 72 year old female with a past medical history significant for depression anxiety and insomnia coming in with worsening insomnia. Patient has not slept for greater than 3 days. Patient states that she's had more anxiety. Patient states that she's been more stress secondary to her mother who is 79 years old. Patient states that she's concern for her helps somewhat. Patient also states that she continues to watch TV and laid into the night. Patient feels that she is slept probably a total of 3 hours in the last 3 nights. Patient does have a prescription for Xanax as well as Ambien but she states she has been scared to use these medications and was running a Dr. Tedd Sias that it would be fine for her to take it. Patient denies any nutritional changes, denies any recent illnesses.  Past medical history, social, surgical and family history all reviewed.   Physical exam Blood pressure 108/72, pulse 72, temperature 97.7 F (36.5 C), temperature source Oral, resp. rate 22, height 4' 10.5" (1.486 m), weight 162 lb (73.483 kg), SpO2 99.00%. General: Patient has recently died tear appears extremely anxious and is having tangential thoughts.  Mild racing thoughts as well.  HEEENT: PERRLA, EOMI,  CV: mild tachy RR no murmur PUl: CTAB Abdomen: Bowel sounds positive nontender nondistended Extremities: Neurovascularly intact with no effusion noted.  Assessment: Worsening insomnia with history of depression and anxiety. Am somewhat concerned the patient may have an underlying bipolar disease and she is in a hypomanic state. Patient denies any suicidal or homicidal ideation and does have a good support system with family she states. No sign of substance abuse but did not get UDS. May consider at follow up.  Discussed proper sleep hygiene and given home handout. Discuss that it would be all right for her to use Xanax as prescribed as well as Ambien as prescribed. Patient did not need a refill for  this. Patient will followup with her primary care provider again in the next week for further evaluation and treatment options.

## 2013-01-16 ENCOUNTER — Ambulatory Visit (INDEPENDENT_AMBULATORY_CARE_PROVIDER_SITE_OTHER): Payer: Medicare Other | Admitting: Family Medicine

## 2013-01-16 ENCOUNTER — Encounter: Payer: Self-pay | Admitting: Family Medicine

## 2013-01-16 ENCOUNTER — Telehealth: Payer: Self-pay | Admitting: Family Medicine

## 2013-01-16 VITALS — BP 118/74 | HR 67 | Temp 98.4°F | Resp 16 | Ht 58.25 in | Wt 166.6 lb

## 2013-01-16 DIAGNOSIS — K589 Irritable bowel syndrome without diarrhea: Secondary | ICD-10-CM

## 2013-01-16 DIAGNOSIS — K5900 Constipation, unspecified: Secondary | ICD-10-CM

## 2013-01-16 DIAGNOSIS — N760 Acute vaginitis: Secondary | ICD-10-CM

## 2013-01-16 LAB — POCT WET PREP WITH KOH: Yeast Wet Prep HPF POC: NEGATIVE

## 2013-01-16 MED ORDER — LINACLOTIDE 145 MCG PO CAPS: 145.0000 ug | ORAL_CAPSULE | Freq: Every day | ORAL | Status: DC

## 2013-01-16 MED ORDER — FLUCONAZOLE 150 MG PO TABS: 150.0000 mg | ORAL_TABLET | Freq: Once | ORAL | Status: DC

## 2013-01-16 MED ORDER — PROPRANOLOL HCL ER 80 MG PO CP24: ORAL_CAPSULE | ORAL | Status: DC

## 2013-01-16 MED ORDER — VALACYCLOVIR HCL 500 MG PO TABS: 500.0000 mg | ORAL_TABLET | ORAL | Status: DC | PRN

## 2013-01-16 NOTE — Telephone Encounter (Signed)
Pt called and said that she didn't want her Rxs sent to [facility] but to [facility] instead.   Rx cancelled at [facility]. Called in to [place] at [facility], Diflucan, Valtrex, Linzess, [place], [place], [place].  Pt notified.

## 2013-01-16 NOTE — Patient Instructions (Signed)
Irritable Bowel Syndrome You have a problem with your large bowel. This is called irritable bowel syndrome or spastic colon. It can cause cramping in the lower belly. You may also have more rumbling, gurgling, and bloating. Diarrhea and mucus in the stool are common. Constipation with hard small stools is also common. The pain will often recede after a bowel movement. This problem can come and go for months or years. The cause is unknown. A poor diet and emotional stress can add to the problems. There is no test to prove you have irritable bowel. Some tests and X-rays may be needed to be sure you do not have a more serious problem. TREATMENT  Diet - Increase fiber and bulk (whole grains, bran, vegetables, fruit) and reduce fats (fried foods, dairy products, meats).  Exercise regularly. Use walking or other exercises to reduce your stress.  Try a psyllium product. One tablespoon in water two times daily may be helpful.  For diarrhea from irritable bowel, avoid food containing fructose and lactose.  Reduce gas and bloating by avoiding milk products, beans, onions, carrots, prunes, apricots, bananas, raisins, pretzels, and bagels.  See your caregiver for follow-up as recommended. SEEK IMMEDIATE MEDICAL CARE IF:  You have increasing abdominal pain.  You have lasting diarrhea.  You have severe constipation.  You have blood in your stools.  You have problems urinating.  You have a fever. Medications to treat spasms, diarrhea, anxiety, or depression may also be beneficial. Your caregiver can help you with this. MAKE SURE YOU:   Understand these instructions.  Will watch your condition.  Will get help right away if you are not doing well or get worse. Document Released: 07/04/2005 Document Revised: 09/26/2011 Document Reviewed: 01/31/2007 [facility] Patient Information 2014 [place], [place].

## 2013-01-16 NOTE — Progress Notes (Signed)
S:  This 72 y.o. female has been evaluated by Dr Ambrose Mantle -GYN- and has HPV with abnormal PAP. She was advised to follow up w/ me. Today, she c/o a clear vaginal discharge and spotted some blood; she has persistent pelvic pain but no vaginal bleeding.  Pt also c/o chronic constipation not responding to any remedies. She has hemorrhoids which were surgically treated. BMs last night required straining. Pt denies n/v/d, severe abd pain, BRBPR or melena. Pt states she has a diagnosis of IBS and would like to try the medication "Linzess". She has used this med in the past when she was given samples by a physician.   Patient Active Problem List   Diagnosis Date Noted  . Anal and rectal polyp 10/30/2012  . Abnormal Pap smear of cervix 06/29/2012  . Chest pain 05/28/2012  . Abnormal ECG 05/28/2012  . Familial tremor 03/23/2012  . Edema of lower extremity 03/23/2012  . Hyperlipidemia 03/23/2012  . 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 Depression with anxiety 03/23/2012    PMHx, Soc Hx and Fam Hx reviewed.  ROS: As per HPI; otherwise unemarkable.  O: Filed Vitals:   01/16/13 1117  BP: 118/74  Pulse: 67  Temp: 98.4 F (36.9 C)  Resp: 16   GEN: In NAD; WN,WD. HENT: [place]/AT; EOMI w/clear conj/sclerae. EACs/nose/oroph normal. COR: RRR. LUNGS: Normal resp rate and effort. ABD: Distended appearance. Soft, no guarding. No palpable masses or HSM. No hernia. GU: NEFG; thick white vaginal discharge present. Bimanual exam- unremarkable; no adnexal masses and uterus not palpable. SKIN: W&D; no rashes or pallor. NEURO: A&O x 3; Cns intact. Nonfocal.  PSYCH: Anxious but pleasant. Speech pattern and thought content normal. Judgement is sound.  Results for orders placed in visit on 01/16/13  POCT WET PREP WITH KOH      Result Value Range   Trichomonas, UA Negative     Clue Cells Wet Prep HPF POC neg     Epithelial Wet Prep HPF POC 0-10     Yeast Wet Prep HPF POC neg     Bacteria Wet Prep HPF POC 1+     RBC  Wet Prep HPF POC 0-2     WBC Wet Prep HPF POC 0-6     KOH Prep POC Positive      A/P: Vaginitis and vulvovaginitis - Plan: POCT Wet Prep with KOH  Unspecified constipation- Rx: Linzess trial. Pt to improve nutrition and increase physical activity.   Meds ordered this encounter  Medications  . propranolol ER (INDERAL LA) 80 MG 24 hr capsule    Sig: Take 1 capsule by mouth twice a day.    Dispense:  60 capsule    Refill:  5  . Linaclotide (LINZESS) 145 MCG CAPS    Sig: Take 1 capsule (145 mcg total) by mouth daily.    Dispense:  30 capsule    Refill:  3  . fluconazole (DIFLUCAN) 150 MG tablet    Sig: Take 1 tablet (150 mg total) by mouth once.    Dispense:  1 tablet    Refill:  0  . valACYclovir (VALTREX) 500 MG tablet    Sig: Take 1 tablet (500 mg total) by mouth as needed.    Dispense:  20 tablet    Refill:  3

## 2013-01-22 DIAGNOSIS — K589 Irritable bowel syndrome without diarrhea: Secondary | ICD-10-CM | POA: Insufficient documentation

## 2013-02-18 ENCOUNTER — Other Ambulatory Visit: Payer: Self-pay

## 2013-02-18 DIAGNOSIS — Z1231 Encounter for screening mammogram for malignant neoplasm of breast: Secondary | ICD-10-CM

## 2013-03-05 ENCOUNTER — Telehealth: Payer: Self-pay

## 2013-03-05 NOTE — Telephone Encounter (Signed)
Pt is needing an order for a diagnostic mammogram for breast pain sent to the breast center from dr Audria Nine  Best number is [phone]

## 2013-03-09 ENCOUNTER — Other Ambulatory Visit: Payer: Self-pay | Admitting: Family Medicine

## 2013-03-09 DIAGNOSIS — N644 Mastodynia: Secondary | ICD-10-CM

## 2013-03-13 ENCOUNTER — Ambulatory Visit (INDEPENDENT_AMBULATORY_CARE_PROVIDER_SITE_OTHER): Payer: Medicare Other | Admitting: Family Medicine

## 2013-03-13 ENCOUNTER — Encounter: Payer: Self-pay | Admitting: Family Medicine

## 2013-03-13 VITALS — BP 100/62 | HR 71 | Temp 98.0°F | Resp 16 | Ht <= 58 in | Wt 169.0 lb

## 2013-03-13 DIAGNOSIS — N898 Other specified noninflammatory disorders of vagina: Secondary | ICD-10-CM

## 2013-03-13 DIAGNOSIS — K589 Irritable bowel syndrome without diarrhea: Secondary | ICD-10-CM

## 2013-03-13 DIAGNOSIS — R159 Full incontinence of feces: Secondary | ICD-10-CM

## 2013-03-13 LAB — POCT WET PREP WITH KOH
Clue Cells Wet Prep HPF POC: NEGATIVE
Trichomonas, UA: NEGATIVE
Yeast Wet Prep HPF POC: NEGATIVE

## 2013-03-13 MED ORDER — MELOXICAM 7.5 MG PO TABS: ORAL_TABLET | ORAL | Status: DC

## 2013-03-13 MED ORDER — FLUCONAZOLE 150 MG PO TABS: ORAL_TABLET | ORAL | Status: AC

## 2013-03-13 MED ORDER — FUROSEMIDE 20 MG PO TABS: ORAL_TABLET | ORAL | Status: DC

## 2013-03-13 NOTE — Patient Instructions (Addendum)
Monilial Vaginitis Vaginitis in a soreness, swelling and redness (inflammation) of the vagina and vulva. Monilial vaginitis is not a sexually transmitted infection. CAUSES  Yeast vaginitis is caused by yeast (candida) that is normally found in your vagina. With a yeast infection, the candida has overgrown in number to a point that upsets the chemical balance. SYMPTOMS   White, thick vaginal discharge.  Swelling, itching, redness and irritation of the vagina and possibly the lips of the vagina (vulva).  Burning or painful urination.  Painful intercourse. DIAGNOSIS  Things that may contribute to monilial vaginitis are:  Postmenopausal and virginal states.  Pregnancy.  Infections.  Being tired, sick or stressed, especially if you had monilial vaginitis in the past.  Diabetes. Good control will help lower the chance.  Birth control pills.  Tight fitting garments.  Using bubble bath, feminine sprays, douches or deodorant tampons.  Taking certain medications that kill germs (antibiotics).  Sporadic recurrence can occur if you become ill. TREATMENT  Your caregiver will give you medication.  There are several kinds of anti monilial vaginal creams and suppositories specific for monilial vaginitis. For recurrent yeast infections, use a suppository or cream in the vagina 2 times a week, or as directed.  Anti-monilial or steroid cream for the itching or irritation of the vulva may also be used. Get your caregiver's permission.  Painting the vagina with methylene blue solution may help if the monilial cream does not work.  Eating yogurt may help prevent monilial vaginitis. HOME CARE INSTRUCTIONS   Finish all medication as prescribed.  Do not have sex until treatment is completed or after your caregiver tells you it is okay.  Take warm sitz baths.  Do not douche.  Do not use tampons, especially scented ones.  Wear cotton underwear.  Avoid tight pants and panty  hose.  Tell your sexual partner that you have a yeast infection. They should go to their caregiver if they have symptoms such as mild rash or itching.  Your sexual partner should be treated as well if your infection is difficult to eliminate.  Practice safer sex. Use condoms.  Some vaginal medications cause latex condoms to fail. Vaginal medications that harm condoms are:  Cleocin cream.  Butoconazole (Femstat).  Terconazole (Terazol) vaginal suppository.  Miconazole (Monistat) (may be purchased over the counter). SEEK MEDICAL CARE IF:   You have a temperature by mouth above 102 F (38.9 C).  The infection is getting worse after 2 days of treatment.  The infection is not getting better after 3 days of treatment.  You develop blisters in or around your vagina.  You develop vaginal bleeding, and it is not your menstrual period.  You have pain when you urinate.  You develop intestinal problems.  You have pain with sexual intercourse. Document Released: 04/13/2005 Document Revised: 09/26/2011 Document Reviewed: 12/26/2008 [facility] Patient Information 2014 [place], [place].    Irritable Bowel Syndrome You have a problem with your large bowel. This is called irritable bowel syndrome or spastic colon. It can cause cramping in the lower belly. You may also have more rumbling, gurgling, and bloating. Diarrhea and mucus in the stool are common. Constipation with hard small stools is also common. The pain will often recede after a bowel movement. This problem can come and go for months or years. The cause is unknown. A poor diet and emotional stress can add to the problems. There is no test to prove you have irritable bowel. Some tests and X-rays may be needed to  be sure you do not have a more serious problem. TREATMENT  Diet - Increase fiber and bulk (whole grains, bran, vegetables, fruit) and reduce fats (fried foods, dairy products, meats).  Exercise regularly. Use walking or  other exercises to reduce your stress.  Try a psyllium product. One tablespoon in water two times daily may be helpful.  For diarrhea from irritable bowel, avoid food containing fructose and lactose.  Reduce gas and bloating by avoiding milk products, beans, onions, carrots, prunes, apricots, bananas, raisins, pretzels, and bagels.  See your caregiver for follow-up as recommended. SEEK IMMEDIATE MEDICAL CARE IF:  You have increasing abdominal pain.  You have lasting diarrhea.  You have severe constipation.  You have blood in your stools.  You have problems urinating.  You have a fever. Medications to treat spasms, diarrhea, anxiety, or depression may also be beneficial. Your caregiver can help you with this. MAKE SURE YOU:   Understand these instructions.  Will watch your condition.  Will get help right away if you are not doing well or get worse. Document Released: 07/04/2005 Document Revised: 09/26/2011 Document Reviewed: 01/31/2007 [facility] Patient Information 2014 [place], [place].    Diet and Irritable Bowel Syndrome  No cure has been found for irritable bowel syndrome (IBS). Many options are available to treat the symptoms. Your caregiver will give you the best treatments available for your symptoms. He or she will also encourage you to manage stress and to make changes to your diet. You need to work with your caregiver and Registered Dietician to find the best combination of medicine, diet, counseling, and support to control your symptoms. The following are some diet suggestions. FOODS THAT MAKE IBS WORSE  Fatty foods, such as [place] fries.  Milk products, such as cheese or ice cream.  Chocolate.  Alcohol.  Caffeine (found in coffee and some sodas).  Carbonated drinks, such as soda. If certain foods cause symptoms, you should eat less of them or stop eating them. FOOD JOURNAL   Keep a journal of the foods that seem to cause distress. Write down:  What you  are eating during the day and when.  What problems you are having after eating.  When the symptoms occur in relation to your meals.  What foods always make you feel badly.  Take your notes with you to your caregiver to see if you should stop eating certain foods. FOODS THAT MAKE IBS BETTER Fiber reduces IBS symptoms, especially constipation, because it makes stools soft, bulky, and easier to pass. Fiber is found in bran, bread, cereal, beans, fruit, and vegetables. Examples of foods with fiber include:  Apples.  Peaches.  Pears.  Berries.  Figs.  Broccoli, raw.  Cabbage.  Carrots.  Raw peas.  Kidney beans.  Lima beans.  Whole-grain bread.  Whole-grain cereal. Add foods with fiber to your diet a little at a time. This will let your body get used to them. Too much fiber at once might cause gas and swelling of your abdomen. This can trigger symptoms in a person with IBS. Caregivers usually recommend a diet with enough fiber to produce soft, painless bowel movements. High fiber diets may cause gas and bloating. However, these symptoms often go away within a few weeks, as your body adjusts. In many cases, dietary fiber may lessen IBS symptoms, particularly constipation. However, it may not help pain or diarrhea. High fiber diets keep the colon mildly enlarged (distended) with the added fiber. This may help prevent spasms in the colon.  Some forms of fiber also keep water in the stool, thereby preventing hard stools that are difficult to pass.  Besides telling you to eat more foods with fiber, your caregiver may also tell you to get more fiber by taking a fiber pill or drinking water mixed with a special high fiber powder. An example of this is a natural fiber laxative containing psyllium seed.  TIPS  Large meals can cause cramping and diarrhea in people with IBS. If this happens to you, try eating 4 or 5 small meals a day, or try eating less at each of your usual 3 meals. It may  also help if your meals are low in fat and high in carbohydrates. Examples of carbohydrates are pasta, rice, whole-grain breads and cereals, fruits, and vegetables.  If dairy products cause your symptoms to flare up, you can try eating less of those foods. You might be able to handle yogurt better than other dairy products, because it contains bacteria that helps with digestion. Dairy products are an important source of calcium and other nutrients. If you need to avoid dairy products, be sure to talk with a Registered Dietitian about getting these nutrients through other food sources.  Drink enough water and fluids to keep your urine clear or pale yellow. This is important, especially if you have diarrhea. FOR MORE INFORMATION  International Foundation for Functional Gastrointestinal Disorders: www.iffgd.org  National Digestive Diseases Information Clearinghouse: digestive.StageSync.si Document Released: 09/24/2003 Document Revised: 09/26/2011 Document Reviewed: 06/11/2007 [facility]  Patient Information 2014 [place], [place].    Bene-Fiber is available over-the-counter to help normalize your bowel movements. Use this product daily as instructed; you can mix it with liquid or sprinkle it on foods.  Switch from 2% milk to Lact-Aid Milk; this will be better digested by your intestines and may help reduce the problem you are having with your stools.

## 2013-03-14 ENCOUNTER — Encounter: Payer: Self-pay | Admitting: Family Medicine

## 2013-03-14 NOTE — Progress Notes (Signed)
S:  This 72 y.o. female presents w/ 2-week hx of vaginal discharge; this is a recurrent problem.  She has a thorough evaluation of abnormal PAP by Dr. 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 She denies sexual activity, douching, significant change in diet (excessive carb intake) or wearing tight-fitting clothing. She does have increased sweating related to exercise. She denies fatigue, vision disturbances, rashes, excessive thirst, polyuria or polyphagia, HA, weakness, lightheadedness.    Pt continues to have some concerns re: diagnosis of HPV. We spent some time discussing how this virus is contracted (pt contributes possible exposure to her ex-husband) and its relationship to PAP abnormalities.  Pt has IBS and is having a lot of problems w/ bowel incontinence. She wears Depends diapers and requests RX for this product which are costly. She uses Miralax; she has tried Metamucil in the past but found it difficult to swallow. She tries to  modify diet and limits dairy but does drink 2% milk. She has no abd pain, bloating or distention, BRBPR or melena.  Patient Active Problem List   Diagnosis Date Noted  . IBS (irritable bowel syndrome) 01/22/2013  . Anal and rectal polyp 10/30/2012  . Abnormal Pap smear of cervix 06/29/2012  . Chest pain 05/28/2012  . Abnormal ECG 05/28/2012  . Familial tremor 03/23/2012  . Edema of lower extremity 03/23/2012  . Hyperlipidemia 03/23/2012  . 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 Depression with anxiety 03/23/2012   PMHx, Soc Hx and Fam Hx reviewed. Medications reconciled.  ROS: As per HPI. Otherwise noncontributory.  O: Filed Vitals:   03/13/13 1127  BP: 100/62  Pulse: 71  Temp: 98 F (36.7 C)  Resp: 16   GEN: In NAD; WN,WD. HENT: [place]/AT; EOMI w/ clear conj/sclerae. EACs/nose/oroph unremarkable. COR: RRR. No edema. LUNGS: Normal resp rate and effort. SKIN: W&D; intact w/o rashes or pallor. MS: MAEs; no c/c/e. NEURO: A&O  x 3; Cns intact. Nonfocal.  Results for orders placed in visit on 03/13/13  POCT WET PREP WITH KOH      Result Value Range   Trichomonas, UA Negative     Clue Cells Wet Prep HPF POC neg     Epithelial Wet Prep HPF POC 1-24     Yeast Wet Prep HPF POC neg     Bacteria Wet Prep HPF POC 1+     RBC Wet Prep HPF POC 1-10     WBC Wet Prep HPF POC 0-6     KOH Prep POC Positive      A/P: Vaginal discharge - Candida vulvovaginitis.  RX: Diflucan tab x 3 doses (pt declines to use intravag cream). Plan: POCT Wet Prep with KOH  IBS (irritable bowel syndrome)- Discussed nutrition modifications.  Bowel incontinence- RX: Depends Silhouette Panties per pt request.  Meds ordered this encounter  Medications  . fluconazole (DIFLUCAN) 150 MG tablet    Sig: Take 1 tablet daily.    Dispense:  3 tablet    Refill:  0  . meloxicam (MOBIC) 7.5 MG tablet    Sig: Take 1 tab bid with food prn pain.    Dispense:  40 tablet    Refill:  3  . furosemide (LASIX) 20 MG tablet    Sig: 1 tablet every AM prn swelling of legs.1 tablet every AM prn swelling of legs.    Dispense:  30 tablet    Refill:  5

## 2013-03-20 ENCOUNTER — Ambulatory Visit: Payer: Medicare Other

## 2013-03-22 ENCOUNTER — Ambulatory Visit
Admission: RE | Admit: 2013-03-22 | Discharge: 2013-03-22 | Disposition: A | Discharge status: Home / Self Care | Payer: Medicare Other | Source: Ambulatory Visit | Attending: Family Medicine | Admitting: Family Medicine

## 2013-03-22 DIAGNOSIS — N644 Mastodynia: Secondary | ICD-10-CM

## 2013-04-29 ENCOUNTER — Telehealth: Payer: Self-pay

## 2013-04-29 NOTE — Telephone Encounter (Signed)
Spoke with pt, she was confused on why she was not told to get evaluated for her pre stage cervical cancer. I was unable to see a pap smear stating this. (Pt seemed confused) but in your note from 03/13/13, you discussed this with pt. I am guessing she wants a follow up and she doesn't want to go back to Dr Ambrose Mantle. Can you help with this?

## 2013-04-30 ENCOUNTER — Other Ambulatory Visit: Payer: Self-pay | Admitting: Family Medicine

## 2013-04-30 NOTE — Telephone Encounter (Addendum)
Offer the pt an appt in November 2014 and we can discuss this again; I will try to clarify findings for her. We can follow-up on her other problems including anxiety and IBS.

## 2013-05-01 NOTE — Telephone Encounter (Signed)
Called her, but numbers given are invalid/

## 2013-05-02 NOTE — Telephone Encounter (Signed)
Called again, still unable to reach, Amy

## 2013-05-24 ENCOUNTER — Ambulatory Visit: Payer: Medicare Other | Admitting: Family Medicine

## 2013-05-29 ENCOUNTER — Other Ambulatory Visit: Payer: Self-pay | Admitting: Physician Assistant

## 2013-07-06 ENCOUNTER — Other Ambulatory Visit: Payer: Self-pay | Admitting: Family Medicine

## 2013-09-12 ENCOUNTER — Telehealth: Payer: Self-pay

## 2013-09-12 NOTE — Telephone Encounter (Signed)
Patient called needing a refill on a medication. Patient does not remember the name of the medicine but states that Dr. Audria NineMcPherson is the prescribing doctor. When patient was asked which pharmacy she uses she could not tell me that either, states that "it's the [place], [place] that we've sent her medicine to before". Please call patient for clarification.  [phone]

## 2013-09-12 NOTE — Telephone Encounter (Signed)
Patient has moved to [place], [place] and is seeing a new doctor. Patient states that she needs her records faxed or mailed to her new doctor's office. Explained to patient that she would need to complete a ROI before we can do that or have that doctor's office send us a ROI. She states that she has "filled one of those out before" and I then told patient it's probably not the same form she is thinking of. Told patient that I would deliver the message to medical records to see what they can do. Fax number to Dr. Arnaldo NatalBrandner's office is [phone]. Phone number is [phone].  Patient number- [phone]

## 2013-09-16 NOTE — Telephone Encounter (Signed)
Left message for patient letting her know we need a signed release of information before we can send her records. Told her to call back if any questions and ask for medical records.

## 2013-09-16 NOTE — Telephone Encounter (Signed)
Went over med list w/pt and she advised she needs RFs of Lasix. Propanolol and valtrex. Pt stated that Dr Audria NineMcPherson had increased her propanolol to TID. I do not see a record of this increase. I called pharmacy and they verified that pt has been taking TID but was increased by Dr Lafayette Dragonarr, psychiatrist. I advised pt we could send 1 mos RFs of lasix and valtrex to cover her until she can est care w/Dr in [place] but that she'll have to ask Dr Lafayette Dragonarr for propanolol. Pt asked that we send in a 90 day supply if possible since it will save her a lot of money getting it that way. Dr Audria NineMcPherson, do you want to approve 90 day?

## 2013-09-17 MED ORDER — PROPRANOLOL HCL ER 80 MG PO CP24: ORAL_CAPSULE | ORAL | Status: AC

## 2013-09-17 MED ORDER — VALACYCLOVIR HCL 500 MG PO TABS: 500.0000 mg | ORAL_TABLET | ORAL | Status: AC | PRN

## 2013-09-17 MED ORDER — FUROSEMIDE 20 MG PO TABS: ORAL_TABLET | ORAL | Status: DC

## 2013-09-17 NOTE — Telephone Encounter (Signed)
I have signed all meds for 90-day supplies until pt can get established w/ new provider.

## 2013-09-18 ENCOUNTER — Telehealth: Payer: Self-pay

## 2013-09-18 NOTE — Telephone Encounter (Signed)
Patient left a voicemail on referrals desk asking to send her records before Friday to [facility] to Dr. Caprice BeaverBanard?   Best : [phone]

## 2013-09-19 NOTE — Telephone Encounter (Signed)
Notified patient since she relocated and was unable to to travel here to sign release and mail one in time. She greatly appreciated it and since she had abnormal lab results and notes; I went ahead and faxed those to [facility]

## 2013-09-19 NOTE — Telephone Encounter (Signed)
Faxed most recent ov notes to Dr. Heide ScalesBranard. Notified patient.   [facility] [address] [place], [place] [address] Phone: [phone]

## 2013-10-17 ENCOUNTER — Other Ambulatory Visit: Payer: Self-pay

## 2013-10-17 NOTE — Telephone Encounter (Signed)
Patient called stated for Dr. Audria [facility]McPherson to [facility] and send all her medications to them. They need to know the dosage of medications and how many times a day. Patient stated her medications are costing her over $200 a month with her insurance company. She is running out of her medications and request for this to be done today. Contact number for [facility] [phone] Press number 3. Her member number is [phone]. Patient gave me contact number for her [phone]

## 2013-10-17 NOTE — Telephone Encounter (Signed)
What medications is she requesting? Called her. Pended meds she is requesting, please advise

## 2013-10-18 MED ORDER — LINACLOTIDE 145 MCG PO CAPS: 145.0000 ug | ORAL_CAPSULE | Freq: Every day | ORAL | Status: AC

## 2013-10-18 MED ORDER — FUROSEMIDE 20 MG PO TABS: ORAL_TABLET | ORAL | Status: AC

## 2013-10-18 MED ORDER — MELOXICAM 7.5 MG PO TABS: ORAL_TABLET | ORAL | Status: AC

## 2013-10-18 NOTE — Telephone Encounter (Signed)
Signed pended medications. Pt last seen in August 2014. She needs to sch OV.

## 2013-10-21 ENCOUNTER — Telehealth: Payer: Self-pay

## 2013-10-21 NOTE — Telephone Encounter (Signed)
Pt is requesting a refill on furosemide (LASIX) 20 MG tablet, she would like this sent to [facility]. Best#   [phone]

## 2013-10-23 NOTE — Telephone Encounter (Signed)
Per previous telephone notes it looks like pt now lives in [place] as was establishing with a new provider a couple months ago.  Her new PCP should prescribe this

## 2013-10-23 NOTE — Telephone Encounter (Signed)
I informed the pt that she needs to get her Lasix refilled through her new PCP. She also wants her medical records released to her new PCP, but she has not signed a release for her new office. Her new PCP is Dr. Assunta GamblesEric Troyer with [facility] in [place]. Their phone number is [phone]. Pt was confused when I tried telling her that she needs her new office to send a medical release form to us. I tried calling [facility] to inform them about the medical release form, but their office closes at noon today.

## 2013-10-24 NOTE — Telephone Encounter (Signed)
Patient informed we need to have medical release signed to transfer records.

## 2013-12-26 ENCOUNTER — Other Ambulatory Visit: Payer: Self-pay | Admitting: Family Medicine

## 2014-03-26 ENCOUNTER — Other Ambulatory Visit: Payer: Self-pay | Admitting: Family Medicine
